# Patient Record
Sex: Male | Born: 1940
Health system: Southern US, Community
[De-identification: ages and names within clinical notes are randomized; demographics above are authoritative.]

## PROBLEM LIST (undated history)

## (undated) DIAGNOSIS — I10 Essential (primary) hypertension: Secondary | ICD-10-CM

## (undated) DIAGNOSIS — I714 Abdominal aortic aneurysm, without rupture, unspecified: Secondary | ICD-10-CM

## (undated) DIAGNOSIS — E785 Hyperlipidemia, unspecified: Secondary | ICD-10-CM

## (undated) HISTORY — DX: Abdominal aortic aneurysm, without rupture: I71.4

## (undated) HISTORY — DX: Abdominal aortic aneurysm, without rupture, unspecified: I71.40

## (undated) HISTORY — PX: HAND SURGERY: SHX662

---

## 1998-01-16 ENCOUNTER — Ambulatory Visit (HOSPITAL_COMMUNITY): Admission: RE | Admit: 1998-01-16 | Discharge: 1998-01-16 | Payer: Self-pay | Admitting: Family Medicine

## 2003-12-16 ENCOUNTER — Emergency Department (HOSPITAL_COMMUNITY): Admission: EM | Admit: 2003-12-16 | Discharge: 2003-12-16 | Payer: Self-pay | Admitting: Emergency Medicine

## 2008-12-20 ENCOUNTER — Emergency Department (HOSPITAL_COMMUNITY): Admission: EM | Admit: 2008-12-20 | Discharge: 2008-12-21 | Payer: Self-pay | Admitting: Emergency Medicine

## 2010-11-15 LAB — CBC
HCT: 43.1 % (ref 39.0–52.0)
Hemoglobin: 14.7 g/dL (ref 13.0–17.0)
MCHC: 34.1 g/dL (ref 30.0–36.0)
MCV: 90.1 fL (ref 78.0–100.0)
RBC: 4.78 MIL/uL (ref 4.22–5.81)
WBC: 10.1 10*3/uL (ref 4.0–10.5)

## 2010-11-15 LAB — COMPREHENSIVE METABOLIC PANEL
ALT: 18 U/L (ref 0–53)
AST: 23 U/L (ref 0–37)
Albumin: 3.7 g/dL (ref 3.5–5.2)
Alkaline Phosphatase: 81 U/L (ref 39–117)
CO2: 27 mEq/L (ref 19–32)
GFR calc non Af Amer: >60 mL/min (ref >60)
Glucose, Bld: 120 mg/dL — ABNORMAL HIGH (ref 70–99)
Total Bilirubin: 0.7 mg/dL (ref 0.3–1.2)

## 2010-11-15 LAB — URINALYSIS, ROUTINE W REFLEX MICROSCOPIC
Glucose, UA: NEGATIVE mg/dL
Leukocytes, UA: NEGATIVE
Protein, ur: NEGATIVE mg/dL
Specific Gravity, Urine: 1.025 (ref 1.005–1.030)

## 2010-11-15 LAB — URINE MICROSCOPIC-ADD ON

## 2010-11-15 LAB — LIPASE, BLOOD: Lipase: 29 U/L (ref 11–59)

## 2010-11-15 LAB — DIFFERENTIAL
Eosinophils Absolute: 0.3 10*3/uL (ref 0.0–0.7)
Eosinophils Relative: 3 % (ref 0–5)
Neutrophils Relative %: 74 % (ref 43–77)

## 2013-09-01 ENCOUNTER — Emergency Department (HOSPITAL_BASED_OUTPATIENT_CLINIC_OR_DEPARTMENT_OTHER): Payer: Medicare FFS

## 2013-09-01 ENCOUNTER — Encounter (HOSPITAL_BASED_OUTPATIENT_CLINIC_OR_DEPARTMENT_OTHER): Payer: Self-pay | Admitting: Emergency Medicine

## 2013-09-01 ENCOUNTER — Emergency Department (HOSPITAL_BASED_OUTPATIENT_CLINIC_OR_DEPARTMENT_OTHER)
Admission: EM | Admit: 2013-09-01 | Discharge: 2013-09-01 | Disposition: A | Discharge status: Home / Self Care | Payer: Medicare FFS | Attending: Emergency Medicine | Admitting: Emergency Medicine

## 2013-09-01 DIAGNOSIS — E785 Hyperlipidemia, unspecified: Secondary | ICD-10-CM | POA: Insufficient documentation

## 2013-09-01 DIAGNOSIS — I714 Abdominal aortic aneurysm, without rupture, unspecified: Secondary | ICD-10-CM | POA: Insufficient documentation

## 2013-09-01 DIAGNOSIS — Z791 Long term (current) use of non-steroidal anti-inflammatories (NSAID): Secondary | ICD-10-CM | POA: Insufficient documentation

## 2013-09-01 DIAGNOSIS — N400 Enlarged prostate without lower urinary tract symptoms: Secondary | ICD-10-CM | POA: Insufficient documentation

## 2013-09-01 DIAGNOSIS — Z79899 Other long term (current) drug therapy: Secondary | ICD-10-CM | POA: Insufficient documentation

## 2013-09-01 DIAGNOSIS — I10 Essential (primary) hypertension: Secondary | ICD-10-CM | POA: Insufficient documentation

## 2013-09-01 DIAGNOSIS — M5430 Sciatica, unspecified side: Secondary | ICD-10-CM | POA: Insufficient documentation

## 2013-09-01 HISTORY — DX: Hyperlipidemia, unspecified: E78.5

## 2013-09-01 HISTORY — DX: Essential (primary) hypertension: I10

## 2013-09-01 LAB — URINALYSIS, ROUTINE W REFLEX MICROSCOPIC
Bilirubin Urine: NEGATIVE
GLUCOSE, UA: NEGATIVE mg/dL
Ketones, ur: NEGATIVE mg/dL
LEUKOCYTES UA: NEGATIVE
Nitrite: NEGATIVE
PH: 5.5 (ref 5.0–8.0)
PROTEIN: NEGATIVE mg/dL
SPECIFIC GRAVITY, URINE: 1.02 (ref 1.005–1.030)
Urobilinogen, UA: 0.2 mg/dL (ref 0.0–1.0)

## 2013-09-01 LAB — URINE MICROSCOPIC-ADD ON

## 2013-09-01 MED ORDER — IBUPROFEN 800 MG PO TABS: 800.0000 mg | ORAL_TABLET | Freq: Three times a day (TID) | ORAL | Status: AC

## 2013-09-01 MED ORDER — HYDROCODONE-ACETAMINOPHEN 5-325 MG PO TABS: 2.0000 | ORAL_TABLET | ORAL | Status: AC | PRN

## 2013-09-01 NOTE — Discharge Instructions (Signed)
Sciatica Followup with Dr. Claiborne Billings this week. You need to have an ultrasound of her aorta every year to make sure it is not enlarging. Follow up with the urologist regarding your large prostate. Dr. Claiborne Billings will schedule you for an MRI of your back and referral to a spine specialist. Return to the ED if you develop new or worsening symptoms. Sciatica is pain, weakness, numbness, or tingling along the path of the sciatic nerve. The nerve starts in the lower back and runs down the back of each leg. The nerve controls the muscles in the lower leg and in the back of the knee, while also providing sensation to the back of the thigh, lower leg, and the sole of your foot. Sciatica is a symptom of another medical condition. For instance, nerve damage or certain conditions, such as a herniated disk or bone spur on the spine, pinch or put pressure on the sciatic nerve. This causes the pain, weakness, or other sensations normally associated with sciatica. Generally, sciatica only affects one side of the body. CAUSES   Herniated or slipped disc.  Degenerative disk disease.  A pain disorder involving the narrow muscle in the buttocks (piriformis syndrome).  Pelvic injury or fracture.  Pregnancy.  Tumor (rare). SYMPTOMS  Symptoms can vary from mild to very severe. The symptoms usually travel from the low back to the buttocks and down the back of the leg. Symptoms can include:  Mild tingling or dull aches in the lower back, leg, or hip.  Numbness in the back of the calf or sole of the foot.  Burning sensations in the lower back, leg, or hip.  Sharp pains in the lower back, leg, or hip.  Leg weakness.  Severe back pain inhibiting movement. These symptoms may get worse with coughing, sneezing, laughing, or prolonged sitting or standing. Also, being overweight may worsen symptoms. DIAGNOSIS  Your caregiver will perform a physical exam to look for common symptoms of sciatica. He or she may ask you to do  certain movements or activities that would trigger sciatic nerve pain. Other tests may be performed to find the cause of the sciatica. These may include:  Blood tests.  X-rays.  Imaging tests, such as an MRI or CT scan. TREATMENT  Treatment is directed at the cause of the sciatic pain. Sometimes, treatment is not necessary and the pain and discomfort goes away on its own. If treatment is needed, your caregiver may suggest:  Over-the-counter medicines to relieve pain.  Prescription medicines, such as anti-inflammatory medicine, muscle relaxants, or narcotics.  Applying heat or ice to the painful area.  Steroid injections to lessen pain, irritation, and inflammation around the nerve.  Reducing activity during periods of pain.  Exercising and stretching to strengthen your abdomen and improve flexibility of your spine. Your caregiver may suggest losing weight if the extra weight makes the back pain worse.  Physical therapy.  Surgery to eliminate what is pressing or pinching the nerve, such as a bone spur or part of a herniated disk. HOME CARE INSTRUCTIONS   Only take over-the-counter or prescription medicines for pain or discomfort as directed by your caregiver.  Apply ice to the affected area for 20 minutes, 3 4 times a day for the first 48 72 hours. Then try heat in the same way.  Exercise, stretch, or perform your usual activities if these do not aggravate your pain.  Attend physical therapy sessions as directed by your caregiver.  Keep all follow-up appointments as directed by  your caregiver.  Do not wear high heels or shoes that do not provide proper support.  Check your mattress to see if it is too soft. A firm mattress may lessen your pain and discomfort. SEEK IMMEDIATE MEDICAL CARE IF:   You lose control of your bowel or bladder (incontinence).  You have increasing weakness in the lower back, pelvis, buttocks, or legs.  You have redness or swelling of your  back.  You have a burning sensation when you urinate.  You have pain that gets worse when you lie down or awakens you at night.  Your pain is worse than you have experienced in the past.  Your pain is lasting longer than 4 weeks.  You are suddenly losing weight without reason. MAKE SURE YOU:  Understand these instructions.  Will watch your condition.  Will get help right away if you are not doing well or get worse. Document Released: 07/18/2001 Document Revised: 01/23/2012 Document Reviewed: 12/03/2011 [facility] Patient Information 2014 [place].   Abdominal Aortic Aneurysm An aneurysm is a weakened or damaged part of an artery wall that bulges from the normal force of blood pumping through the body. An abdominal aortic aneurysm is an aneurysm that occurs in the lower part of the aorta, the main artery of the body.  The major concern with an abdominal aortic aneurysm is that it can enlarge and burst (rupture) or blood can flow between the layers of the wall of the aorta through a tear (aorticdissection). Both of these conditions can cause bleeding inside the body and can be life threatening unless diagnosed and treated promptly. CAUSES  The exact cause of an abdominal aortic aneurysm is unknown. Some contributing factors are:   A hardening of the arteries caused by the buildup of fat and other substances in the lining of a blood vessel (arteriosclerosis).  Inflammation of the walls of an artery (arteritis).   Connective tissue diseases, such as Marfan syndrome.   Abdominal trauma.   An infection, such as syphilis or staphylococcus, in the wall of the aorta (infectious aortitis) caused by bacteria. RISK FACTORS  Risk factors that contribute to an abdominal aortic aneurysm may include:  Age older than 42 years.   High blood pressure (hypertension).  Male gender.  Ethnicity (white race).  Obesity.  Family history of aneurysm (first degree relatives  only).  Tobacco use. PREVENTION  The following healthy lifestyle habits may help decrease your risk of abdominal aortic aneurysm:  Quitting smoking. Smoking can raise your blood pressure and cause arteriosclerosis.  Limiting or avoiding alcohol.  Keeping your blood pressure, blood sugar level, and cholesterol levels within normal limits.  Decreasing your salt intake. In somepeople, too much salt can raise blood pressure and increase your risk of abdominal aortic aneurysm.  Eating a diet low in saturated fats and cholesterol.  Increasing your fiber intake by including whole grains, vegetables, and fruits in your diet. Eating these foods may help lower blood pressure.  Maintaining a healthy weight.  Staying physically active and exercising regularly. SYMPTOMS  The symptoms of abdominal aortic aneurysm may vary depending on the size and rate of growth of the aneurysm.Most grow slowly and do not have any symptoms. When symptoms do occur, they may include:  Pain (abdomen, side, lower back, or groin). The pain may vary in intensity. A sudden onset of severe pain may indicate that the aneurysm has ruptured.  Feeling full after eating only small amounts of food.  Nausea or vomiting or both.  Feeling a pulsating lump in the abdomen.  Feeling faint or passing out. DIAGNOSIS  Since most unruptured abdominal aortic aneurysms have no symptoms, they are often discovered during diagnostic exams for other conditions. An aneurysm may be found during the following procedures:  Ultrasonography (A one-time screening for abdominal aortic aneurysm by ultrasonography is also recommended for all men aged 55-75 years who have ever smoked).  X-ray exams.  A computed tomography (CT).  Magnetic resonance imaging (MRI).  Angiography or arteriography. TREATMENT  Treatment of an abdominal aortic aneurysm depends on the size of your aneurysm, your age, and risk factors for rupture. Medication to  control blood pressure and pain may be used to manage aneurysms smaller than 6 cm. Regular monitoring for enlargement may be recommended by your caregiver if:  The aneurysm is 3 4 cm in size (an annual ultrasonography may be recommended).  The aneurysm is 4 4.5 cm in size (an ultrasonography every 6 months may be recommended).  The aneurysm is larger than 4.5 cm in size (your caregiver may ask that you be examined by a vascular surgeon). If your aneurysm is larger than 6 cm, surgical repair may be recommended. There are two main methods for repair of an aneurysm:   Endovascular repair (a minimally invasive surgery). This is done most often.  Open repair. This method is used if an endovascular repair is not possible. Document Released: 05/03/2005 Document Revised: 11/18/2012 Document Reviewed: 08/23/2012 [facility] Patient Information 2014 [place], [place].

## 2013-09-01 NOTE — ED Notes (Signed)
MD at bedside. 

## 2013-09-01 NOTE — ED Provider Notes (Signed)
CSN: [id number]     Arrival date & time 09/01/13  1503 History  This chart was scribed for Adam Essex, MD by Ludger Nutting, ED Scribe. This patient was seen in room MH02/MH02 and the patient's care was started 5:39 PM.    Chief Complaint  Patient presents with  . Back Pain    The history is provided by the patient. No language interpreter was used.    HPI Comments: Adam Parker is a 73 y.o. male with past medical history of HLD, HTN who presents to the Emergency Department complaining of 3 weeks of intermittent, unchanged left lower back pain that radiates to the left buttock and left leg. He states walking and certain positions worsen the pain. He was seen by PCP and was prescribed prednisone. He finished the course of medication without relief. He denies weakness, numbness, change in bowel or bladder function, abdominal pain, fever, vomiting.  PCP Dr. Scarlette Ar, [facility]    Past Medical History  Diagnosis Date  . Hypertension   . Hyperlipemia    History reviewed. No pertinent past surgical history. History reviewed. No pertinent family history. History  Substance Use Topics  . Smoking status: Never Smoker   . Smokeless tobacco: Not on file  . Alcohol Use: No    Review of Systems A complete 10 system review of systems was obtained and all systems are negative except as noted in the HPI and PMH.   Allergies  Review of patient's allergies indicates no known allergies.  Home Medications   Current Outpatient Rx  Name  Route  Sig  Dispense  Refill  . atorvastatin (LIPITOR) 40 MG tablet   Oral   Take 40 mg by mouth daily.         Marland Kitchen terazosin (HYTRIN) 10 MG capsule   Oral   Take 10 mg by mouth at bedtime.         Marland Kitchen HYDROcodone-acetaminophen (NORCO/VICODIN) 5-325 MG per tablet   Oral   Take 2 tablets by mouth every 4 (four) hours as needed.   10 tablet   0   . ibuprofen (ADVIL,MOTRIN) 800 MG tablet   Oral   Take 1 tablet (800 mg total) by mouth 3 (three)  times daily.   21 tablet   0    BP 143/78  Pulse 89  Temp(Src) 98.2 F (36.8 C) (Oral)  Resp 16  Ht 5\' 5"  (1.651 m)  Wt 175 lb (79.379 kg)  BMI 29.12 kg/m2  SpO2 99% Physical Exam  Nursing note and vitals reviewed. Constitutional: He is oriented to person, place, and time. He appears well-developed and well-nourished.  HENT:  Head: Normocephalic and atraumatic.  Cardiovascular: Normal rate, regular rhythm, normal heart sounds and intact distal pulses.   Pulmonary/Chest: Effort normal and breath sounds normal. No respiratory distress. He has no wheezes. He has no rales.  Abdominal: Soft. He exhibits no distension and no mass. There is no tenderness. There is no rebound and no guarding.  Musculoskeletal:  Left para spinal lumbar pain.  5/5 strength in bilateral lower extremities. Ankle plantar and dorsiflexion intact. Great toe extension intact bilaterally. +2 DP and PT pulses. +2 patellar reflexes bilaterally. Normal gait.   Neurological: He is alert and oriented to person, place, and time. He has normal strength. No sensory deficit.  Skin: Skin is warm and dry.  Psychiatric: He has a normal mood and affect.    ED Course  Procedures (including critical care time)  DIAGNOSTIC STUDIES: Oxygen Saturation  is 99% on RA, normal by my interpretation.    COORDINATION OF CARE: 5:45 PM Will order imaging. Discussed treatment plan with pt at bedside and pt agreed to plan.   Labs Review Labs Reviewed  URINALYSIS, ROUTINE W REFLEX MICROSCOPIC - Abnormal; Notable for the following:    Hgb urine dipstick MODERATE (*)    All other components within normal limits  URINE MICROSCOPIC-ADD ON   Imaging Review Ct Abdomen Pelvis Wo Contrast  09/01/2013   CLINICAL DATA:  Pt c.o left side pain that radiates down into left leg x 3 wks, blood in urine  EXAM: CT ABDOMEN AND PELVIS WITHOUT CONTRAST  TECHNIQUE: Multidetector CT imaging of the abdomen and pelvis was performed following the standard  protocol without intravenous contrast.  COMPARISON:  None.  FINDINGS: Evaluation lung bases demonstrates a vague area of minimal increased density along the posterior periphery lateral segment right middle lobe image 7 series 4. Hypoventilation is identified within the lung bases.  Noncontrast evaluation of the liver, spleen, adrenals, pancreas is unremarkable.  There is no evidence of hydronephrosis, hydroureter, nephrolithiasis, nor ureteral lithiasis.  Along the lateral periphery of the midpole of right kidney a 1 cm fat containing ill-defined exophytic nodule is appreciated. This finding may represent the sequela of a small angiomyolipoma. Image 38 series 3. The kidney is otherwise unremarkable within the limitations of a noncontrasted CT. The left kidney is unremarkable.  There is no evidence of abdominal aortic aneurysm. Atherosclerotic calcifications are identified within the abdominal aorta and to a lesser extent iliac vessels.  There is no evidence of bowel obstruction, enteritis, colitis, diverticulitis, nor secondary signs reflecting appendicitis  There is no evidence of abdominal free fluid, loculated fluid collections, masses, nor adenopathy within the limitations of a noncontrast CT.  Evaluation of the pelvis demonstrates a nodular masslike area along the anterior dome of the prostate demonstrating mass effect upon the base of the bladder. This finding is best appreciated on image 81 series 2. Dx area measures 2.9 cm x 2.4 cm in transverse by AP dimensions. No further pelvic free fluid, nor loculated fluid collections are appreciated. Prominent bilateral inguinal lymph nodes are identified with the largest on the right measuring 9 cm in short axis and on the left 8 cm. No further evidence of adenopathy nor further masses are identified.  Degenerative disc disease changes with grade 2 anterolisthesis appreciated at the L5-S1 level. Bilateral pars defects appreciated involving S1. No evidence of  aggressive appearing osseous lesions.  IMPRESSION: 1. Masslike area within the superior aspect of the prostate causing mass effect upon the base of the bladder. Differential considerations are an area secondary to benign prostatic hypertrophy more ominous etiologies cannot be excluded. Correlation with PSA is recommended as well as urologic consultation. 2. Prominent bilateral inguinal lymph nodes. 3. No evidence of obstructive or inflammatory abnormalities. Specifically there is no evidence of nephrolithiasis, hydronephrosis, hydroureter or ureterolithiasis. 4. Likely small angiomyolipoma involving the right kidney 5. Severe degenerative disc disease changes at the L4-5 level with grade 2 anterolisthesis. Bilateral pars defects are appreciated at this level. Surgical consultation recommended if clinically warranted. 6. Likely incidental finding within the right middle lobe differential considerations are atelectasis versus mild infiltrate versus a region of fibrotic change.   Electronically Signed   By: Margaree Mackintosh M.D.   On: 09/01/2013 19:55   Dg Lumbar Spine Complete  09/01/2013   CLINICAL DATA:  Low back pain for 3 weeks.  EXAM: LUMBAR SPINE - COMPLETE 4+  VIEW  COMPARISON:  CT abdomen and pelvis 12/21/2008.  FINDINGS: Vertebral body height is maintained. As on CT scan, bilateral L5 pars interarticularis defects result in 1.5 cm anterolisthesis L5 on S1. Facet arthropathy L3-4 and L4-5 is noted. There is some loss of disc space height at L4-5. Paraspinous structures are unremarkable.  IMPRESSION: No acute finding.  Bilateral L5 pars interarticularis defects result in 1.5 cm anterolisthesis L5 on S1.  Lower lumbar degenerative disease.   Electronically Signed   By: Inge Rise M.D.   On: 09/01/2013 18:20   [facility]  09/01/2013   CLINICAL DATA:  Left lower back pain, hypertension  EXAM: ULTRASOUND OF ABDOMINAL AORTA  TECHNIQUE: Ultrasound examination of the abdominal aorta was performed  to evaluate for abdominal aortic aneurysm.  COMPARISON:  12/21/2008  FINDINGS: Abdominal Aorta  Diffuse atherosclerotic change and mild ectasia of the abdominal aorta.  Maximum AP  Diameter:  4.2 cm  Maximum TRV  Diameter: 3.1 cm  IMPRESSION: Abdominal aortic atherosclerosis, maximal diameter approximately 4.2 cm.   Electronically Signed   By: Daryll Brod M.D.   On: 09/01/2013 18:52    EKG Interpretation   None       MDM   1. Sciatica   2. Enlarged prostate   3. AAA (abdominal aortic aneurysm)    3 weeks of left lower back pain that radiates down left leg. No weakness, numbness or tingling. No bowel or bladder incontinence. Treated with prednisone by PCP without relief. No injury.  Neurovascularly intact. No evidence of cauda equina or cord compression.  History and exam consistent with sciatica. Incidental finding of enlarged abdominal aorta of 4.2 cm.  Discussed with Dr. Bridgett Larsson of vascular surgery who recommended annual surveillance. No evidence of leaking triple-A. Discussed with patient and wife. He understands need for yearly surveillance of aneurysm. CT findings discussed with patient and wife. Patient is aware of his enlarged prostate has had a PSA checked. He is advised to followup with his PCP for referral to both urology and spine specialist. He is able to ambulate without difficulty. Will treat sciatica with pain medications and antiinflammatories. He has tried steroids in the past which were not helpful.  I personally performed the services described in this documentation, which was scribed in my presence. The recorded information has been reviewed and is accurate.   Adam Essex, MD 09/01/13 [phone]

## 2013-09-01 NOTE — ED Notes (Signed)
Patient transported to X-ray & Ultrasound.

## 2013-09-01 NOTE — ED Notes (Signed)
Pt c/o lower back pain which radiates down left leg, seen by PMD finished prednisone pk  But pain cont

## 2013-09-26 ENCOUNTER — Encounter: Payer: Self-pay | Admitting: Vascular Surgery

## 2013-09-26 ENCOUNTER — Other Ambulatory Visit: Payer: Self-pay | Admitting: *Deleted

## 2013-10-06 ENCOUNTER — Encounter: Payer: Self-pay | Admitting: Vascular Surgery

## 2013-10-07 ENCOUNTER — Encounter: Payer: Self-pay | Admitting: Vascular Surgery

## 2013-10-07 ENCOUNTER — Ambulatory Visit (INDEPENDENT_AMBULATORY_CARE_PROVIDER_SITE_OTHER): Payer: Medicare FFS | Admitting: Vascular Surgery

## 2013-10-07 VITALS — BP 119/74 | HR 75 | Ht 65.0 in | Wt 176.1 lb

## 2013-10-07 DIAGNOSIS — M549 Dorsalgia, unspecified: Secondary | ICD-10-CM

## 2013-10-07 NOTE — Progress Notes (Signed)
Subjective:     Patient ID: Adam Parker, male   DOB: [birth year], 73 y.o.   MRN: [id number]  HPI this 73 year old male recently was seen in the emergency department because of some left lower back discomfort and a history of hypertension. He also has some slight blood in his urine. Ultrasound was interpreted as 4.2 cm abdominal aortic aneurysm as well as degenerative disc disease. CT scan was then performed which did not reveal any evidence of abdominal aortic aneurysm. Patient was referred today for evaluation of possible abdominal aortic aneurysm.  Past Medical History  Diagnosis Date  . Hypertension   . Hyperlipemia   . AAA (abdominal aortic aneurysm)     History  Substance Use Topics  . Smoking status: Former Smoker    Quit date: 08/07/2001  . Smokeless tobacco: Not on file  . Alcohol Use: 0.6 oz/week    1 Cans of beer per week    Family History  Problem Relation Age of Onset  . Heart disease Brother   . Peripheral vascular disease Brother   . Cancer Brother     prostate  . Hypertension Sister   . Diabetes Mother   . Hypertension Mother   . Hyperlipidemia Mother     No Known Allergies  Current outpatient prescriptions:aspirin 81 MG tablet, Take 81 mg by mouth daily., Disp: , Rfl: ;  atorvastatin (LIPITOR) 40 MG tablet, Take 40 mg by mouth daily., Disp: , Rfl: ;  ibuprofen (ADVIL,MOTRIN) 800 MG tablet, Take 1 tablet (800 mg total) by mouth 3 (three) times daily., Disp: 21 tablet, Rfl: 0;  terazosin (HYTRIN) 10 MG capsule, Take 10 mg by mouth at bedtime., Disp: , Rfl:  HYDROcodone-acetaminophen (NORCO/VICODIN) 5-325 MG per tablet, Take 2 tablets by mouth every 4 (four) hours as needed., Disp: 10 tablet, Rfl: 0  BP 119/74  Pulse 75  Ht 5\' 5"  (1.651 m)  Wt 176 lb 1.6 oz (79.878 kg)  BMI 29.30 kg/m2  SpO2 99%  Body mass index is 29.3 kg/(m^2).           Review of Systems denies chest pain, dyspnea on exertion, PND, orthopnea, hemoptysis, claudication. Does  have lower back discomfort.     Objective:   Physical Exam BP 119/74  Pulse 75  Ht 5\' 5"  (1.651 m)  Wt 176 lb 1.6 oz (79.878 kg)  BMI 29.30 kg/m2  SpO2 99%  Gen.-alert and oriented x3 in no apparent distress HEENT normal for age Lungs no rhonchi or wheezing Cardiovascular regular rhythm no murmurs carotid pulses 3+ palpable no bruits audible Abdomen soft nontender no palpable masses Musculoskeletal free of  major deformities Skin clear -no rashes Neurologic normal Lower extremities 3+ femoral and dorsalis pedis pulses palpable bilaterally with no edema  Today I reviewed the CT scan of the abdomen which was performed on January 26. There is no evidence of abdominal aortic aneurysm.       Assessment:     I discussed these findings and these 2 discrepancies on the ultrasound study and a CT scan reports.. I also explained to the patient and his wife that the CT scan clearly rules out an infrarenal abdominal aortic aneurysm.    Plan:     No evidence of abdominal aortic aneurysm on CT scan dated 09/01/2013 Return to see me on when necessary basis

## 2013-10-14 ENCOUNTER — Ambulatory Visit: Payer: Medicare FFS | Attending: Family Medicine

## 2013-10-14 DIAGNOSIS — R5381 Other malaise: Secondary | ICD-10-CM | POA: Insufficient documentation

## 2013-10-14 DIAGNOSIS — M545 Low back pain, unspecified: Secondary | ICD-10-CM | POA: Insufficient documentation

## 2013-10-14 DIAGNOSIS — M25569 Pain in unspecified knee: Secondary | ICD-10-CM | POA: Diagnosis not present

## 2013-10-14 DIAGNOSIS — IMO0001 Reserved for inherently not codable concepts without codable children: Secondary | ICD-10-CM | POA: Insufficient documentation

## 2013-10-17 ENCOUNTER — Ambulatory Visit: Payer: Medicare FFS | Admitting: Physical Therapy

## 2013-10-17 DIAGNOSIS — IMO0001 Reserved for inherently not codable concepts without codable children: Secondary | ICD-10-CM | POA: Diagnosis not present

## 2013-10-21 ENCOUNTER — Ambulatory Visit: Payer: Medicare FFS | Admitting: Physical Therapy

## 2013-10-21 DIAGNOSIS — IMO0001 Reserved for inherently not codable concepts without codable children: Secondary | ICD-10-CM | POA: Diagnosis not present

## 2013-10-24 ENCOUNTER — Ambulatory Visit: Payer: Medicare FFS | Admitting: Physical Therapy

## 2013-10-24 DIAGNOSIS — IMO0001 Reserved for inherently not codable concepts without codable children: Secondary | ICD-10-CM | POA: Diagnosis not present

## 2013-10-28 ENCOUNTER — Ambulatory Visit: Payer: Medicare FFS | Admitting: Physical Therapy

## 2013-10-28 DIAGNOSIS — IMO0001 Reserved for inherently not codable concepts without codable children: Secondary | ICD-10-CM | POA: Diagnosis not present

## 2013-10-30 ENCOUNTER — Ambulatory Visit: Payer: Medicare FFS

## 2013-11-04 ENCOUNTER — Ambulatory Visit: Payer: Medicare FFS | Admitting: Physical Therapy

## 2013-11-06 ENCOUNTER — Ambulatory Visit: Payer: Medicare FFS | Admitting: Physical Therapy

## 2013-11-11 ENCOUNTER — Ambulatory Visit: Payer: Medicare FFS | Attending: Family Medicine | Admitting: Physical Therapy

## 2013-11-11 DIAGNOSIS — IMO0001 Reserved for inherently not codable concepts without codable children: Secondary | ICD-10-CM | POA: Insufficient documentation

## 2013-11-11 DIAGNOSIS — R5381 Other malaise: Secondary | ICD-10-CM | POA: Insufficient documentation

## 2013-11-11 DIAGNOSIS — M25569 Pain in unspecified knee: Secondary | ICD-10-CM | POA: Insufficient documentation

## 2013-11-11 DIAGNOSIS — M545 Low back pain, unspecified: Secondary | ICD-10-CM | POA: Insufficient documentation

## 2014-10-12 DIAGNOSIS — H524 Presbyopia: Secondary | ICD-10-CM | POA: Diagnosis not present

## 2014-10-12 DIAGNOSIS — H5210 Myopia, unspecified eye: Secondary | ICD-10-CM | POA: Diagnosis not present

## 2014-10-12 DIAGNOSIS — E784 Other hyperlipidemia: Secondary | ICD-10-CM | POA: Diagnosis not present

## 2014-12-07 DIAGNOSIS — R300 Dysuria: Secondary | ICD-10-CM | POA: Diagnosis not present

## 2015-06-21 DIAGNOSIS — D485 Neoplasm of uncertain behavior of skin: Secondary | ICD-10-CM | POA: Diagnosis not present

## 2015-06-21 DIAGNOSIS — E785 Hyperlipidemia, unspecified: Secondary | ICD-10-CM | POA: Diagnosis not present

## 2015-06-21 DIAGNOSIS — N400 Enlarged prostate without lower urinary tract symptoms: Secondary | ICD-10-CM | POA: Diagnosis not present

## 2015-06-21 DIAGNOSIS — Z0001 Encounter for general adult medical examination with abnormal findings: Secondary | ICD-10-CM | POA: Diagnosis not present

## 2015-06-21 DIAGNOSIS — Z79899 Other long term (current) drug therapy: Secondary | ICD-10-CM | POA: Diagnosis not present

## 2015-06-21 DIAGNOSIS — K5900 Constipation, unspecified: Secondary | ICD-10-CM | POA: Diagnosis not present

## 2015-06-28 DIAGNOSIS — C44621 Squamous cell carcinoma of skin of unspecified upper limb, including shoulder: Secondary | ICD-10-CM | POA: Diagnosis not present

## 2015-06-28 DIAGNOSIS — C44629 Squamous cell carcinoma of skin of left upper limb, including shoulder: Secondary | ICD-10-CM | POA: Diagnosis not present

## 2015-06-30 DIAGNOSIS — Z86010 Personal history of colonic polyps: Secondary | ICD-10-CM | POA: Diagnosis not present

## 2015-06-30 DIAGNOSIS — N400 Enlarged prostate without lower urinary tract symptoms: Secondary | ICD-10-CM | POA: Diagnosis not present

## 2015-06-30 DIAGNOSIS — K5900 Constipation, unspecified: Secondary | ICD-10-CM | POA: Diagnosis not present

## 2015-07-20 DIAGNOSIS — Z1211 Encounter for screening for malignant neoplasm of colon: Secondary | ICD-10-CM | POA: Diagnosis not present

## 2015-07-20 DIAGNOSIS — K635 Polyp of colon: Secondary | ICD-10-CM | POA: Diagnosis not present

## 2015-07-20 DIAGNOSIS — Z86010 Personal history of colonic polyps: Secondary | ICD-10-CM | POA: Diagnosis not present

## 2015-07-20 DIAGNOSIS — K5730 Diverticulosis of large intestine without perforation or abscess without bleeding: Secondary | ICD-10-CM | POA: Diagnosis not present

## 2015-07-20 DIAGNOSIS — D123 Benign neoplasm of transverse colon: Secondary | ICD-10-CM | POA: Diagnosis not present

## 2015-12-02 DIAGNOSIS — H25813 Combined forms of age-related cataract, bilateral: Secondary | ICD-10-CM | POA: Diagnosis not present

## 2015-12-02 DIAGNOSIS — H524 Presbyopia: Secondary | ICD-10-CM | POA: Diagnosis not present

## 2015-12-02 DIAGNOSIS — H5210 Myopia, unspecified eye: Secondary | ICD-10-CM | POA: Diagnosis not present

## 2015-12-02 DIAGNOSIS — E7800 Pure hypercholesterolemia, unspecified: Secondary | ICD-10-CM | POA: Diagnosis not present

## 2015-12-28 IMAGING — CT CT ABD-PELV W/O CM
2 of 4 series · 15 of 46 positions shown, 17 images · non-contrast
Comparison: None.

CLINICAL DATA: Pt c.o left side pain that radiates down into left
leg x 3 wks, blood in urine

EXAM:
CT ABDOMEN AND PELVIS WITHOUT CONTRAST
TECHNIQUE: Multidetector CT imaging of the abdomen and pelvis was performed
following the standard protocol without intravenous contrast.

[Series 2: abd/pelvis 5.0 b31f · axial · 0.73mm/px · z∈[-456,-36]mm · 12 of 94 slices shown, 14 images]
[im 5/94  soft-tissue]
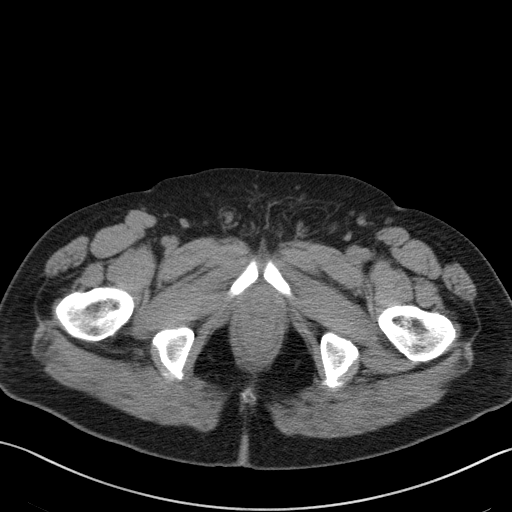
[im 5/94  bone]
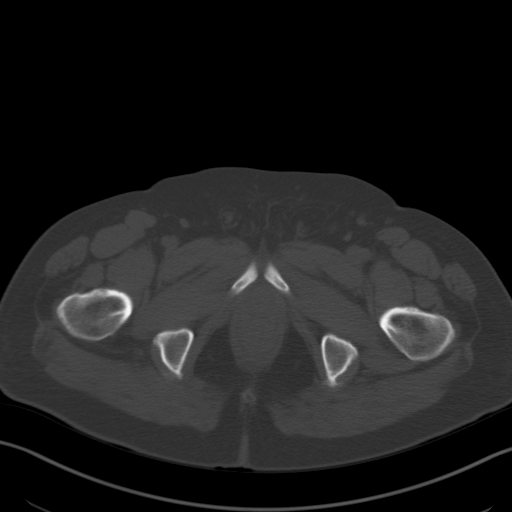
[im 13/94  soft-tissue]
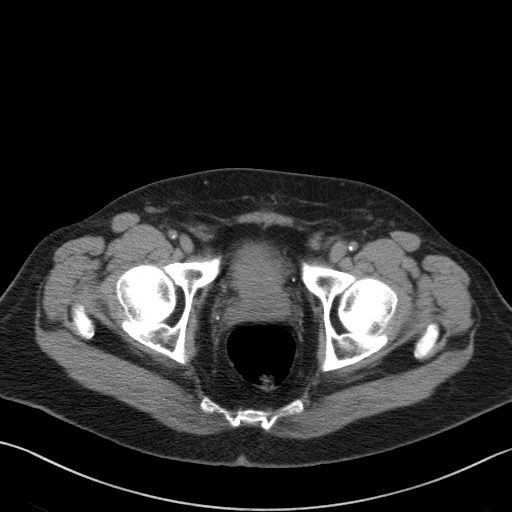
[im 22/94  soft-tissue]
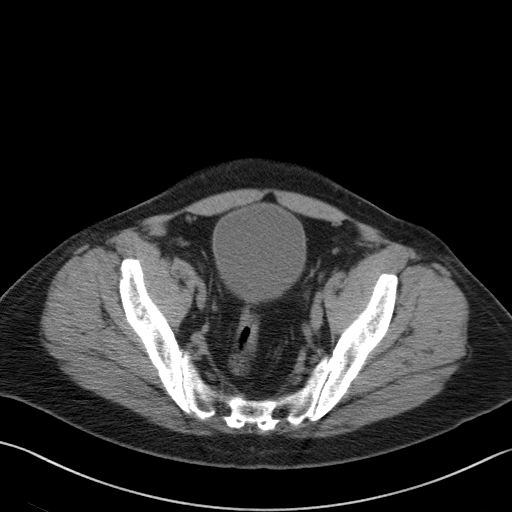
[im 30/94  soft-tissue]
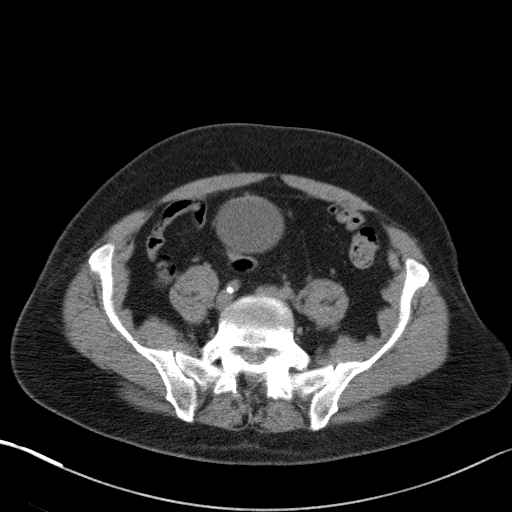
[im 34/94  soft-tissue]
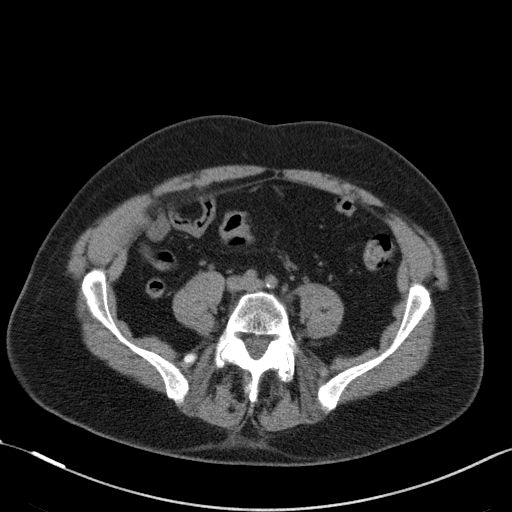
[im 43/94  soft-tissue]
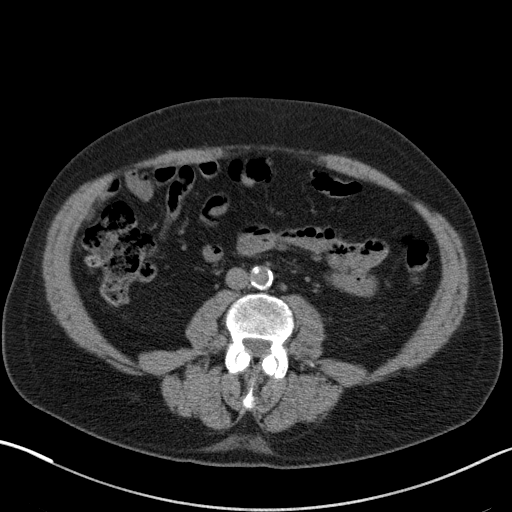
[im 51/94  soft-tissue]
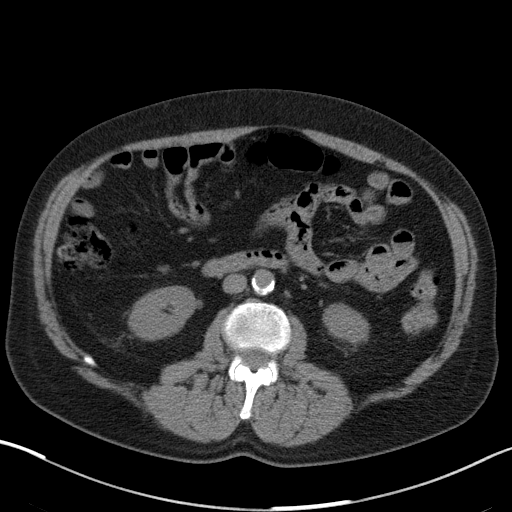
[im 60/94  soft-tissue]
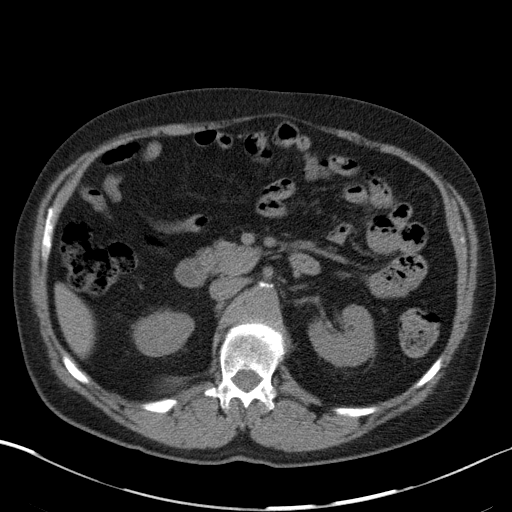
[im 64/94  soft-tissue]
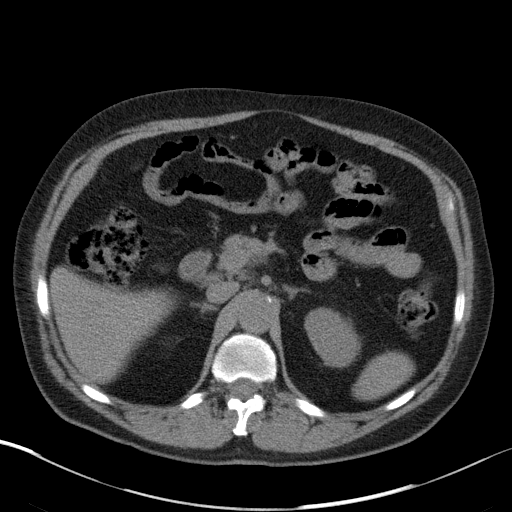
[im 64/94  bone]
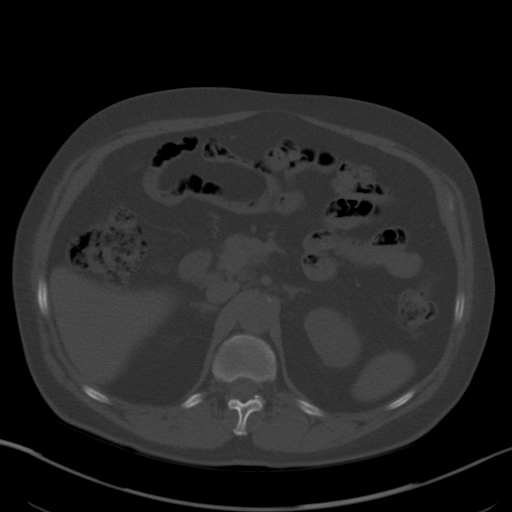
[im 72/94  soft-tissue]
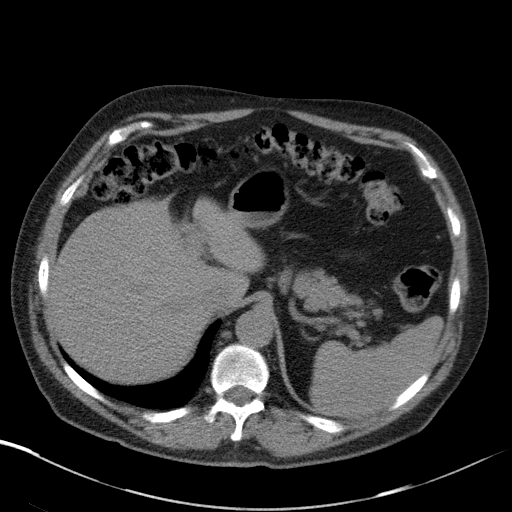
[im 81/94  soft-tissue]
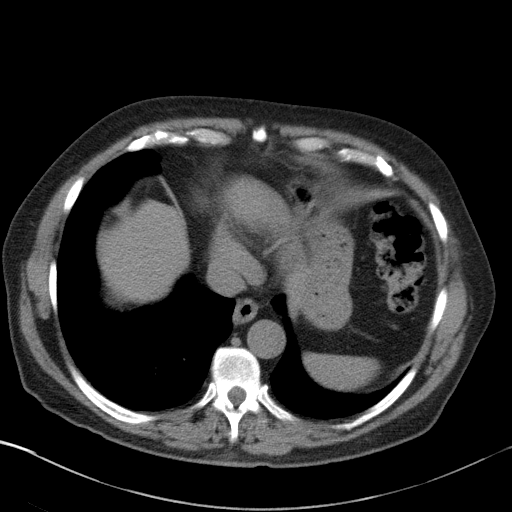
[im 89/94  soft-tissue]
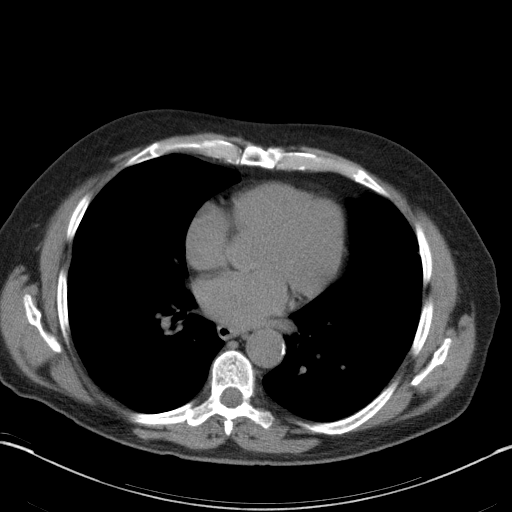

[Series 5: abd/pelvis 3.0 coronal · coronal · 0.79mm/px · 3 of 84 slices shown]
[im 28/84  soft-tissue]
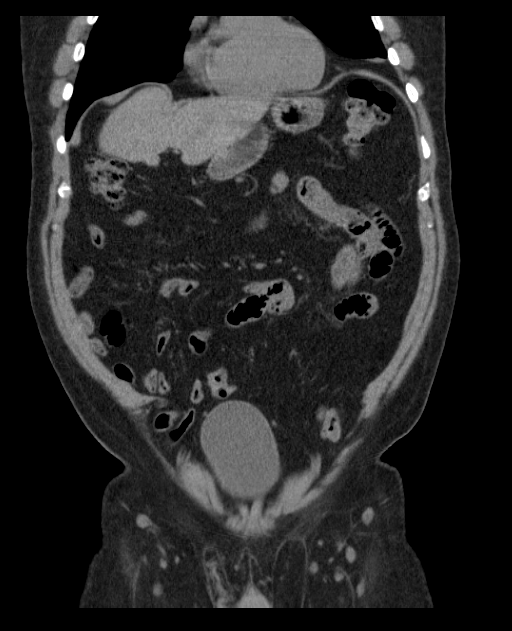
[im 37/84  soft-tissue]
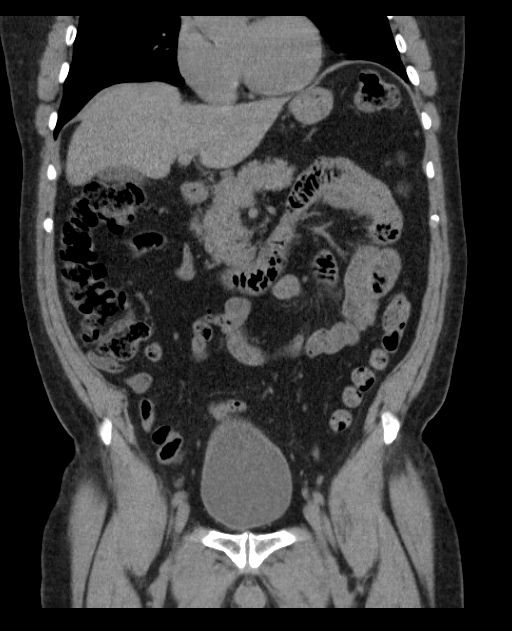
[im 47/84  soft-tissue]
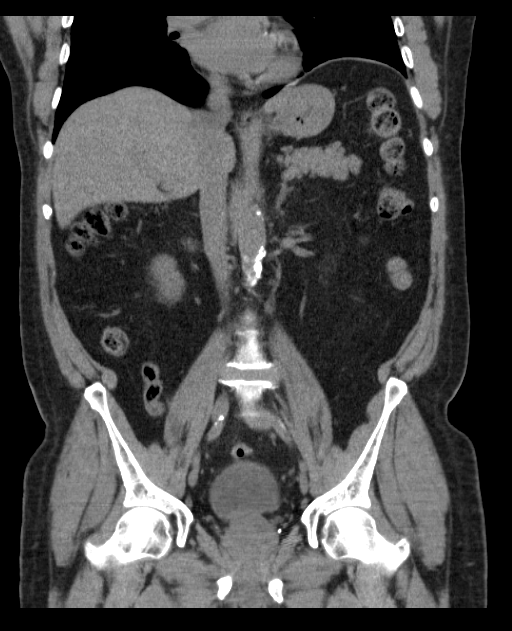

[15 of 46 positions shown; findings below may reference images not displayed]

FINDINGS: Evaluation lung bases demonstrates a vague area of minimal increased
density along the posterior periphery lateral segment right middle
lobe image 7 series 4. Hypoventilation is identified within the lung
bases.

Noncontrast evaluation of the liver, spleen, adrenals, pancreas is
unremarkable.

There is no evidence of hydronephrosis, hydroureter,
nephrolithiasis, nor ureteral lithiasis.

Along the lateral periphery of the midpole of right kidney a 1 cm
fat containing ill-defined exophytic nodule is appreciated. This
finding may represent the sequela of a small angiomyolipoma. Image
38 series 3. The kidney is otherwise unremarkable within the
limitations of a noncontrasted CT. The left kidney is unremarkable.

There is no evidence of abdominal aortic aneurysm. Atherosclerotic
calcifications are identified within the abdominal aorta and to a
lesser extent iliac vessels.

There is no evidence of bowel obstruction, enteritis, colitis,
diverticulitis, nor secondary signs reflecting appendicitis

There is no evidence of abdominal free fluid, loculated fluid
collections, masses, nor adenopathy within the limitations of a
noncontrast CT.

Evaluation of the pelvis demonstrates a nodular masslike area along
the anterior dome of the prostate demonstrating mass effect upon the
base of the bladder. This finding is best appreciated on image 81
series 2. Dx area measures 2.9 cm x 2.4 cm in transverse by AP
dimensions. No further pelvic free fluid, nor loculated fluid
collections are appreciated. Prominent bilateral inguinal lymph
nodes are identified with the largest on the right measuring 9 cm in
short axis and on the left 8 cm. No further evidence of adenopathy
nor further masses are identified.

Degenerative disc disease changes with grade 2 anterolisthesis
appreciated at the L5-S1 level. Bilateral pars defects appreciated
involving S1. No evidence of aggressive appearing osseous lesions.
IMPRESSION: 1. Masslike area within the superior aspect of the prostate causing
mass effect upon the base of the bladder. Differential
considerations are an area secondary to benign prostatic hypertrophy
more ominous etiologies cannot be excluded. Correlation with PSA is
recommended as well as urologic consultation.
2. Prominent bilateral inguinal lymph nodes.
3. No evidence of obstructive or inflammatory abnormalities.
Specifically there is no evidence of nephrolithiasis,
hydronephrosis, hydroureter or ureterolithiasis.
4. Likely small angiomyolipoma involving the right kidney
5. Severe degenerative disc disease changes at the L4-5 level with
grade 2 anterolisthesis. Bilateral pars defects are appreciated at
this level. Surgical consultation recommended if clinically
warranted.
6. Likely incidental finding within the right middle lobe
differential considerations are atelectasis versus mild infiltrate
versus a region of fibrotic change.

## 2016-01-07 DIAGNOSIS — S30861A Insect bite (nonvenomous) of abdominal wall, initial encounter: Secondary | ICD-10-CM | POA: Diagnosis not present

## 2016-05-19 DIAGNOSIS — X32XXXD Exposure to sunlight, subsequent encounter: Secondary | ICD-10-CM | POA: Diagnosis not present

## 2016-05-19 DIAGNOSIS — B078 Other viral warts: Secondary | ICD-10-CM | POA: Diagnosis not present

## 2016-05-19 DIAGNOSIS — L820 Inflamed seborrheic keratosis: Secondary | ICD-10-CM | POA: Diagnosis not present

## 2016-05-19 DIAGNOSIS — L570 Actinic keratosis: Secondary | ICD-10-CM | POA: Diagnosis not present

## 2016-05-19 DIAGNOSIS — Z1283 Encounter for screening for malignant neoplasm of skin: Secondary | ICD-10-CM | POA: Diagnosis not present

## 2016-07-05 DIAGNOSIS — B349 Viral infection, unspecified: Secondary | ICD-10-CM | POA: Diagnosis not present

## 2016-07-05 DIAGNOSIS — R05 Cough: Secondary | ICD-10-CM | POA: Diagnosis not present

## 2016-07-13 DIAGNOSIS — Z79899 Other long term (current) drug therapy: Secondary | ICD-10-CM | POA: Diagnosis not present

## 2016-07-13 DIAGNOSIS — Z125 Encounter for screening for malignant neoplasm of prostate: Secondary | ICD-10-CM | POA: Diagnosis not present

## 2016-07-13 DIAGNOSIS — E785 Hyperlipidemia, unspecified: Secondary | ICD-10-CM | POA: Diagnosis not present

## 2016-07-20 DIAGNOSIS — E785 Hyperlipidemia, unspecified: Secondary | ICD-10-CM | POA: Diagnosis not present

## 2016-07-20 DIAGNOSIS — Z0000 Encounter for general adult medical examination without abnormal findings: Secondary | ICD-10-CM | POA: Diagnosis not present

## 2016-07-20 DIAGNOSIS — N183 Chronic kidney disease, stage 3 (moderate): Secondary | ICD-10-CM | POA: Diagnosis not present

## 2016-07-20 DIAGNOSIS — N400 Enlarged prostate without lower urinary tract symptoms: Secondary | ICD-10-CM | POA: Diagnosis not present

## 2016-11-23 DIAGNOSIS — E785 Hyperlipidemia, unspecified: Secondary | ICD-10-CM | POA: Diagnosis not present

## 2016-11-23 DIAGNOSIS — N400 Enlarged prostate without lower urinary tract symptoms: Secondary | ICD-10-CM | POA: Diagnosis not present

## 2016-11-23 DIAGNOSIS — Z0000 Encounter for general adult medical examination without abnormal findings: Secondary | ICD-10-CM | POA: Diagnosis not present

## 2016-11-23 DIAGNOSIS — N183 Chronic kidney disease, stage 3 (moderate): Secondary | ICD-10-CM | POA: Diagnosis not present

## 2016-12-22 DIAGNOSIS — E7800 Pure hypercholesterolemia, unspecified: Secondary | ICD-10-CM | POA: Diagnosis not present

## 2016-12-22 DIAGNOSIS — Z0100 Encounter for examination of eyes and vision without abnormal findings: Secondary | ICD-10-CM | POA: Diagnosis not present

## 2016-12-22 DIAGNOSIS — H25811 Combined forms of age-related cataract, right eye: Secondary | ICD-10-CM | POA: Diagnosis not present

## 2016-12-22 DIAGNOSIS — H35362 Drusen (degenerative) of macula, left eye: Secondary | ICD-10-CM | POA: Diagnosis not present

## 2017-06-11 DIAGNOSIS — Z23 Encounter for immunization: Secondary | ICD-10-CM | POA: Diagnosis not present

## 2017-08-10 DIAGNOSIS — Z0000 Encounter for general adult medical examination without abnormal findings: Secondary | ICD-10-CM | POA: Diagnosis not present

## 2017-08-10 DIAGNOSIS — N183 Chronic kidney disease, stage 3 (moderate): Secondary | ICD-10-CM | POA: Diagnosis not present

## 2017-08-10 DIAGNOSIS — E785 Hyperlipidemia, unspecified: Secondary | ICD-10-CM | POA: Diagnosis not present

## 2017-08-10 DIAGNOSIS — N400 Enlarged prostate without lower urinary tract symptoms: Secondary | ICD-10-CM | POA: Diagnosis not present

## 2017-08-16 DIAGNOSIS — Z79899 Other long term (current) drug therapy: Secondary | ICD-10-CM | POA: Diagnosis not present

## 2017-08-16 DIAGNOSIS — Z0000 Encounter for general adult medical examination without abnormal findings: Secondary | ICD-10-CM | POA: Diagnosis not present

## 2017-08-16 DIAGNOSIS — Z23 Encounter for immunization: Secondary | ICD-10-CM | POA: Diagnosis not present

## 2017-08-16 DIAGNOSIS — H3530 Unspecified macular degeneration: Secondary | ICD-10-CM | POA: Diagnosis not present

## 2017-08-16 DIAGNOSIS — E785 Hyperlipidemia, unspecified: Secondary | ICD-10-CM | POA: Diagnosis not present

## 2017-08-16 DIAGNOSIS — N529 Male erectile dysfunction, unspecified: Secondary | ICD-10-CM | POA: Diagnosis not present

## 2017-08-16 DIAGNOSIS — N400 Enlarged prostate without lower urinary tract symptoms: Secondary | ICD-10-CM | POA: Diagnosis not present

## 2017-08-16 DIAGNOSIS — N183 Chronic kidney disease, stage 3 (moderate): Secondary | ICD-10-CM | POA: Diagnosis not present

## 2017-08-23 DIAGNOSIS — H02051 Trichiasis without entropian right upper eyelid: Secondary | ICD-10-CM | POA: Diagnosis not present

## 2017-08-23 DIAGNOSIS — H25013 Cortical age-related cataract, bilateral: Secondary | ICD-10-CM | POA: Diagnosis not present

## 2017-08-23 DIAGNOSIS — H2513 Age-related nuclear cataract, bilateral: Secondary | ICD-10-CM | POA: Diagnosis not present

## 2017-08-23 DIAGNOSIS — H353131 Nonexudative age-related macular degeneration, bilateral, early dry stage: Secondary | ICD-10-CM | POA: Diagnosis not present

## 2017-08-28 DIAGNOSIS — L570 Actinic keratosis: Secondary | ICD-10-CM | POA: Diagnosis not present

## 2017-08-28 DIAGNOSIS — L820 Inflamed seborrheic keratosis: Secondary | ICD-10-CM | POA: Diagnosis not present

## 2017-08-28 DIAGNOSIS — D225 Melanocytic nevi of trunk: Secondary | ICD-10-CM | POA: Diagnosis not present

## 2017-08-28 DIAGNOSIS — X32XXXD Exposure to sunlight, subsequent encounter: Secondary | ICD-10-CM | POA: Diagnosis not present

## 2018-02-26 DIAGNOSIS — N390 Urinary tract infection, site not specified: Secondary | ICD-10-CM | POA: Diagnosis not present

## 2018-02-26 DIAGNOSIS — J069 Acute upper respiratory infection, unspecified: Secondary | ICD-10-CM | POA: Diagnosis not present

## 2018-02-26 DIAGNOSIS — R300 Dysuria: Secondary | ICD-10-CM | POA: Diagnosis not present

## 2018-09-10 DIAGNOSIS — H2511 Age-related nuclear cataract, right eye: Secondary | ICD-10-CM | POA: Diagnosis not present

## 2018-09-10 DIAGNOSIS — H02051 Trichiasis without entropian right upper eyelid: Secondary | ICD-10-CM | POA: Diagnosis not present

## 2018-09-10 DIAGNOSIS — H353131 Nonexudative age-related macular degeneration, bilateral, early dry stage: Secondary | ICD-10-CM | POA: Diagnosis not present

## 2018-09-10 DIAGNOSIS — H25013 Cortical age-related cataract, bilateral: Secondary | ICD-10-CM | POA: Diagnosis not present

## 2018-09-10 DIAGNOSIS — H2513 Age-related nuclear cataract, bilateral: Secondary | ICD-10-CM | POA: Diagnosis not present

## 2018-09-26 DIAGNOSIS — E785 Hyperlipidemia, unspecified: Secondary | ICD-10-CM | POA: Diagnosis not present

## 2018-09-26 DIAGNOSIS — B356 Tinea cruris: Secondary | ICD-10-CM | POA: Diagnosis not present

## 2018-09-26 DIAGNOSIS — Z0000 Encounter for general adult medical examination without abnormal findings: Secondary | ICD-10-CM | POA: Diagnosis not present

## 2018-09-26 DIAGNOSIS — N183 Chronic kidney disease, stage 3 (moderate): Secondary | ICD-10-CM | POA: Diagnosis not present

## 2018-09-26 DIAGNOSIS — Z125 Encounter for screening for malignant neoplasm of prostate: Secondary | ICD-10-CM | POA: Diagnosis not present

## 2018-09-26 DIAGNOSIS — R499 Unspecified voice and resonance disorder: Secondary | ICD-10-CM | POA: Diagnosis not present

## 2018-09-26 DIAGNOSIS — Z23 Encounter for immunization: Secondary | ICD-10-CM | POA: Diagnosis not present

## 2018-09-26 DIAGNOSIS — Z79899 Other long term (current) drug therapy: Secondary | ICD-10-CM | POA: Diagnosis not present

## 2018-10-05 DIAGNOSIS — R21 Rash and other nonspecific skin eruption: Secondary | ICD-10-CM | POA: Diagnosis not present

## 2018-10-05 DIAGNOSIS — N341 Nonspecific urethritis: Secondary | ICD-10-CM | POA: Diagnosis not present

## 2018-10-05 DIAGNOSIS — R300 Dysuria: Secondary | ICD-10-CM | POA: Diagnosis not present

## 2018-11-07 DIAGNOSIS — N5201 Erectile dysfunction due to arterial insufficiency: Secondary | ICD-10-CM | POA: Diagnosis not present

## 2018-11-07 DIAGNOSIS — R972 Elevated prostate specific antigen [PSA]: Secondary | ICD-10-CM | POA: Diagnosis not present

## 2018-11-07 DIAGNOSIS — R3912 Poor urinary stream: Secondary | ICD-10-CM | POA: Diagnosis not present

## 2018-12-02 DIAGNOSIS — D485 Neoplasm of uncertain behavior of skin: Secondary | ICD-10-CM | POA: Diagnosis not present

## 2018-12-13 DIAGNOSIS — C44729 Squamous cell carcinoma of skin of left lower limb, including hip: Secondary | ICD-10-CM | POA: Diagnosis not present

## 2019-01-24 DIAGNOSIS — Z85828 Personal history of other malignant neoplasm of skin: Secondary | ICD-10-CM | POA: Diagnosis not present

## 2019-01-24 DIAGNOSIS — Z08 Encounter for follow-up examination after completed treatment for malignant neoplasm: Secondary | ICD-10-CM | POA: Diagnosis not present

## 2019-02-13 DIAGNOSIS — H25013 Cortical age-related cataract, bilateral: Secondary | ICD-10-CM | POA: Diagnosis not present

## 2019-02-13 DIAGNOSIS — H353132 Nonexudative age-related macular degeneration, bilateral, intermediate dry stage: Secondary | ICD-10-CM | POA: Diagnosis not present

## 2019-02-13 DIAGNOSIS — H2513 Age-related nuclear cataract, bilateral: Secondary | ICD-10-CM | POA: Diagnosis not present

## 2019-02-26 DIAGNOSIS — H25811 Combined forms of age-related cataract, right eye: Secondary | ICD-10-CM | POA: Diagnosis not present

## 2019-02-26 DIAGNOSIS — H2511 Age-related nuclear cataract, right eye: Secondary | ICD-10-CM | POA: Diagnosis not present

## 2019-03-04 DIAGNOSIS — H25012 Cortical age-related cataract, left eye: Secondary | ICD-10-CM | POA: Diagnosis not present

## 2019-03-04 DIAGNOSIS — H2512 Age-related nuclear cataract, left eye: Secondary | ICD-10-CM | POA: Diagnosis not present

## 2019-03-12 DIAGNOSIS — H25012 Cortical age-related cataract, left eye: Secondary | ICD-10-CM | POA: Diagnosis not present

## 2019-03-12 DIAGNOSIS — H2512 Age-related nuclear cataract, left eye: Secondary | ICD-10-CM | POA: Diagnosis not present

## 2019-04-30 DIAGNOSIS — L03113 Cellulitis of right upper limb: Secondary | ICD-10-CM | POA: Diagnosis not present

## 2019-05-07 DIAGNOSIS — C44629 Squamous cell carcinoma of skin of left upper limb, including shoulder: Secondary | ICD-10-CM | POA: Diagnosis not present

## 2019-06-10 DIAGNOSIS — Z08 Encounter for follow-up examination after completed treatment for malignant neoplasm: Secondary | ICD-10-CM | POA: Diagnosis not present

## 2019-06-10 DIAGNOSIS — Z85828 Personal history of other malignant neoplasm of skin: Secondary | ICD-10-CM | POA: Diagnosis not present

## 2019-09-04 DIAGNOSIS — H0102A Squamous blepharitis right eye, upper and lower eyelids: Secondary | ICD-10-CM | POA: Diagnosis not present

## 2019-09-04 DIAGNOSIS — H02051 Trichiasis without entropian right upper eyelid: Secondary | ICD-10-CM | POA: Diagnosis not present

## 2019-09-04 DIAGNOSIS — Z961 Presence of intraocular lens: Secondary | ICD-10-CM | POA: Diagnosis not present

## 2019-09-04 DIAGNOSIS — H04123 Dry eye syndrome of bilateral lacrimal glands: Secondary | ICD-10-CM | POA: Diagnosis not present

## 2019-10-28 DIAGNOSIS — H0102A Squamous blepharitis right eye, upper and lower eyelids: Secondary | ICD-10-CM | POA: Diagnosis not present

## 2019-10-28 DIAGNOSIS — L820 Inflamed seborrheic keratosis: Secondary | ICD-10-CM | POA: Diagnosis not present

## 2019-10-28 DIAGNOSIS — Z961 Presence of intraocular lens: Secondary | ICD-10-CM | POA: Diagnosis not present

## 2019-10-28 DIAGNOSIS — B078 Other viral warts: Secondary | ICD-10-CM | POA: Diagnosis not present

## 2019-10-28 DIAGNOSIS — H04123 Dry eye syndrome of bilateral lacrimal glands: Secondary | ICD-10-CM | POA: Diagnosis not present

## 2019-10-28 DIAGNOSIS — H353132 Nonexudative age-related macular degeneration, bilateral, intermediate dry stage: Secondary | ICD-10-CM | POA: Diagnosis not present
